# Patient Record
Sex: Male | Born: 1990 | Race: Black or African American | Hispanic: No | Marital: Single | Smoking: Current every day smoker
Health system: Southern US, Community
[De-identification: ages and names within clinical notes are randomized; demographics above are authoritative.]

---
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
## 2016-05-19 ENCOUNTER — Emergency Department (HOSPITAL_COMMUNITY)
Admission: EM | Admit: 2016-05-19 | Discharge: 2016-05-19 | Disposition: A | Discharge status: Home / Self Care | Payer: Self-pay | Attending: Emergency Medicine | Admitting: Emergency Medicine

## 2016-05-19 ENCOUNTER — Encounter (HOSPITAL_COMMUNITY): Payer: Self-pay | Admitting: Emergency Medicine

## 2016-05-19 DIAGNOSIS — Z7982 Long term (current) use of aspirin: Secondary | ICD-10-CM | POA: Insufficient documentation

## 2016-05-19 DIAGNOSIS — F17200 Nicotine dependence, unspecified, uncomplicated: Secondary | ICD-10-CM | POA: Insufficient documentation

## 2016-05-19 DIAGNOSIS — R002 Palpitations: Secondary | ICD-10-CM | POA: Insufficient documentation

## 2016-05-19 LAB — I-STAT CHEM 8, ED
BUN: 7 mg/dL (ref 6–20)
CALCIUM ION: 1.17 mmol/L (ref 1.13–1.30)
Chloride: 104 mmol/L (ref 101–111)
Creatinine, Ser: 0.9 mg/dL (ref 0.61–1.24)
Glucose, Bld: 93 mg/dL (ref 65–99)
HCT: 50 % (ref 39.0–52.0)
Hemoglobin: 17 g/dL (ref 13.0–17.0)
Potassium: 3.8 mmol/L (ref 3.5–5.1)
SODIUM: 138 mmol/L (ref 135–145)
TCO2: 22 mmol/L (ref 0–100)

## 2016-05-19 LAB — I-STAT TROPONIN, ED: Troponin i, poc: 0 ng/mL (ref 0.00–0.08)

## 2016-05-19 NOTE — ED Triage Notes (Signed)
Pt reports intermittent palpitations for the past few days. Woke up with palpitations. Drank some water which relieved palpitations and dizziness. Denies CP. Reports feelings of SOB, but speaking in full sentences with no difficulty.

## 2016-05-19 NOTE — Discharge Instructions (Signed)
Palpitations can occur for a variety of reasons, including dehydration and caffeine use. Stay hydrated with plenty of water, and avoid caffeine intake. Sometimes you'll need further work up like a holter monitor to catch any abnormalities in the heart rhythm, but this can be done as an outpatient. Follow up with [facility] in 1 week to establish care and for further work up of your symptoms. Return to the ER for changes or worsening symptoms.

## 2016-05-19 NOTE — ED Provider Notes (Signed)
[facility]-EMERGENCY DEPT Provider Note   CSN: [id number] Arrival date & time: 05/19/16  1000  First Provider Contact:  First MD Initiated Contact with Patient 05/19/16 1020        History   Chief Complaint Chief Complaint  Patient presents with  . Palpitations    HPI Gregory Woods is a 25 y.o. Otherwise healthy male who presents to the ED with complaints of intermittent palpitations 1 year. Patient states that last year at the end of the summer he had some palpitations, felt that at that time it was due to dehydration and drinking monster energy drinks, and he stopped drinking caffeine which seemed to improve his symptoms. He states that every 2 months or so he has recurrent symptoms but usually it's attributable to dehydration and caffeine use. This morning he awoke around 4 AM with palpitations and a feeling of being dehydrated, states he had not drank anything last night, so he drank quite a bit of water this morning and it seemed to improve his symptoms. He describes palpitations as "skipped beat" but denies that it feels fast, reports that it is intermittent, worse with caffeine use and dehydration, and improved with water. Occasionally he feels lightheaded when he stands up, but this is currently resolved. He also reports occasional shortness of breath which is also currently resolved. All of the symptoms are resolved on presentation. He admits that he had a red bull energy drink yesterday around 4 PM. Positive smoker. Denies family history of cardiac disease. Has never been seen by Dr. for these symptoms. Does not have a current PCP.  He denies any fevers, chills, chest pain, diaphoresis, ongoing lightheadedness, ongoing shortness breath, leg swelling, recent travel/surgery/immobilization, personal or family history of DVT/PE, abdominal pain, nausea vomiting, diarrhea, constipation, dysuria, hematuria, numbness, tingling, focal weakness, claudication, orthopnea.   The history is provided by  the patient and medical records. No language interpreter was used.    History reviewed. No pertinent past medical history.  There are no active problems to display for this patient.   History reviewed. No pertinent surgical history.     Home Medications    Prior to Admission medications   Not on File    Family History History reviewed. No pertinent family history.  Social History Social History  Substance Use Topics  . Smoking status: Current Every Day Smoker  . Smokeless tobacco: Never Used  . Alcohol use Not on file     Comment: occasional     Allergies   Review of patient's allergies indicates no known allergies.   Review of Systems Review of Systems  Constitutional: Negative for chills, diaphoresis and fever.  Respiratory: Positive for shortness of breath (occasionally, currently resolved).   Cardiovascular: Positive for palpitations. Negative for chest pain and leg swelling.  Gastrointestinal: Negative for abdominal pain, constipation, diarrhea, nausea and vomiting.  Genitourinary: Negative for dysuria and hematuria.  Musculoskeletal: Negative for arthralgias and myalgias.  Skin: Negative for color change.  Allergic/Immunologic: Negative for immunocompromised state.  Neurological: Positive for light-headedness (with standing, intermittent, currently resolved). Negative for weakness and numbness.  Psychiatric/Behavioral: Negative for confusion.   10 Systems reviewed and are negative for acute change except as noted in the HPI.   Physical Exam Updated Vital Signs BP 139/83   Pulse 78   Temp 98 F (36.7 C) (Oral)   Resp 16   SpO2 96%   Physical Exam  Constitutional: He is oriented to person, place, and time. Vital signs are normal. He  appears well-developed and well-nourished.  Non-toxic appearance. No distress.  Afebrile, nontoxic, NAD  HENT:  Head: Normocephalic and atraumatic.  Mouth/Throat: Oropharynx is clear and moist and mucous membranes are  normal.  Eyes: Conjunctivae and EOM are normal. Right eye exhibits no discharge. Left eye exhibits no discharge.  Neck: Normal range of motion. Neck supple.  Cardiovascular: Normal rate, regular rhythm, normal heart sounds and intact distal pulses.  Exam reveals no gallop and no friction rub.   No murmur heard. RRR, nl s1/s2, no m/r/g, distal pulses intact, no pedal edema   Pulmonary/Chest: Effort normal and breath sounds normal. No respiratory distress. He has no decreased breath sounds. He has no wheezes. He has no rhonchi. He has no rales. He exhibits no tenderness, no deformity and no retraction.  CTAB in all lung fields, no w/r/r, no hypoxia or increased WOB, speaking in full sentences, SpO2 96% on RA Chest wall nonTTP without crepitus, deformities, or retractions   Abdominal: Soft. Normal appearance and bowel sounds are normal. He exhibits no distension. There is no tenderness. There is no rigidity, no rebound, no guarding, no CVA tenderness, no tenderness at McBurney's point and negative Murphy's sign.  Musculoskeletal: Normal range of motion.  MAE x4 Strength and sensation grossly intact Distal pulses intact Gait steady No pedal edema, neg homan's bilaterally   Neurological: He is alert and oriented to person, place, and time. He has normal strength. No sensory deficit.  Skin: Skin is warm, dry and intact. No rash noted.  Psychiatric: He has a normal mood and affect.  Nursing note and vitals reviewed.    ED Treatments / Results  Labs (all labs ordered are listed, but only abnormal results are displayed) Labs Reviewed  Rosezena Sensor, ED  I-STAT CHEM 8, ED    EKG  EKG Interpretation  Date/Time:  Friday May 19 2016 10:13:17 EDT Ventricular Rate:  62 PR Interval:    QRS Duration: 98 QT Interval:  396 QTC Calculation: 403 R Axis:   85 Text Interpretation:  Sinus rhythm Probable left atrial enlargement Left ventricular hypertrophy No old tracing to compare Confirmed  by FLOYD MD, DANIEL ([id number]) on 05/19/2016 10:21:11 AM       Radiology No results found.  Procedures Procedures (including critical care time)  Medications Ordered in ED Medications - No data to display   Initial Impression / Assessment and Plan / ED Course  I have reviewed the triage vital signs and the nursing notes.  Pertinent labs & imaging results that were available during my care of the patient were reviewed by me and considered in my medical decision making (see chart for details).  Clinical Course    25 y.o. male here with intermittent palpitations for 1 yr, happens every few months, states it feels like a skipped beat. Happens when he's dehydrated and when he drinks caffeine. States sometimes he feels lightheaded when it happens and he stands, but currently resolved. Occasional SOB at work, but currently resolved. No CP, no ongoing SOB, PERC neg, doubt PE. EKG with LVH and LAE but otherwise no ischemic findings, and no PVCs/PACs. Symptoms currently resolved, however, so may need holter monitor outpatient to find cause. Will get trop and Chem 8 to eval for possible other etiologies, but doubt need for CXR. Will reassess after chem 8 and troponin are done.  11:49 AM Trop neg, chem 8 unremarkable. Will have him f/up with [facility] in 1wk to establish care and for ongoing management of palpitations, likely holter monitoring  as outpatient. Discussed avoidance of caffeine and staying well hydrated. I explained the diagnosis and have given explicit precautions to return to the ER including for any other new or worsening symptoms. The patient understands and accepts the medical plan as it's been dictated and I have answered their questions. Discharge instructions concerning home care and prescriptions have been given. The patient is STABLE and is discharged to home in good condition.   Final Clinical Impressions(s) / ED Diagnoses   Final diagnoses:  Palpitations    New Prescriptions New  Prescriptions   No medications on file     Ly Wass Camprubi-Soms, PA-C 05/19/16 1150    Melene Plan, DO 05/19/16 1337
